# Patient Record
Sex: Male | Born: 1960 | Race: White | Hispanic: No | Marital: Single | Smoking: Current every day smoker
Health system: Southern US, Community
[De-identification: ages and names within clinical notes are randomized; demographics above are authoritative.]

## PROBLEM LIST (undated history)

## (undated) DIAGNOSIS — Z789 Other specified health status: Secondary | ICD-10-CM

## (undated) HISTORY — PX: NO PAST SURGERIES: SHX2092

---

## 2012-01-08 ENCOUNTER — Emergency Department (HOSPITAL_COMMUNITY): Payer: Self-pay

## 2012-01-08 ENCOUNTER — Encounter (HOSPITAL_COMMUNITY): Payer: Self-pay | Admitting: Emergency Medicine

## 2012-01-08 ENCOUNTER — Inpatient Hospital Stay (HOSPITAL_COMMUNITY)
Admission: EM | Admit: 2012-01-08 | Discharge: 2012-01-14 | DRG: 394 | Disposition: A | Discharge status: Home / Self Care | Payer: Self-pay | Attending: General Surgery | Admitting: General Surgery

## 2012-01-08 DIAGNOSIS — D62 Acute posthemorrhagic anemia: Secondary | ICD-10-CM | POA: Diagnosis present

## 2012-01-08 DIAGNOSIS — T797XXA Traumatic subcutaneous emphysema, initial encounter: Secondary | ICD-10-CM | POA: Diagnosis present

## 2012-01-08 DIAGNOSIS — S32009A Unspecified fracture of unspecified lumbar vertebra, initial encounter for closed fracture: Secondary | ICD-10-CM

## 2012-01-08 DIAGNOSIS — S7010XA Contusion of unspecified thigh, initial encounter: Secondary | ICD-10-CM | POA: Diagnosis present

## 2012-01-08 DIAGNOSIS — F1010 Alcohol abuse, uncomplicated: Secondary | ICD-10-CM | POA: Diagnosis present

## 2012-01-08 DIAGNOSIS — S01502A Unspecified open wound of oral cavity, initial encounter: Secondary | ICD-10-CM | POA: Diagnosis present

## 2012-01-08 DIAGNOSIS — A4901 Methicillin susceptible Staphylococcus aureus infection, unspecified site: Secondary | ICD-10-CM | POA: Diagnosis present

## 2012-01-08 DIAGNOSIS — L0201 Cutaneous abscess of face: Secondary | ICD-10-CM | POA: Diagnosis not present

## 2012-01-08 DIAGNOSIS — S0083XA Contusion of other part of head, initial encounter: Secondary | ICD-10-CM | POA: Diagnosis present

## 2012-01-08 DIAGNOSIS — F17200 Nicotine dependence, unspecified, uncomplicated: Secondary | ICD-10-CM | POA: Diagnosis present

## 2012-01-08 DIAGNOSIS — S01512A Laceration without foreign body of oral cavity, initial encounter: Secondary | ICD-10-CM | POA: Diagnosis present

## 2012-01-08 DIAGNOSIS — L03211 Cellulitis of face: Secondary | ICD-10-CM | POA: Diagnosis not present

## 2012-01-08 DIAGNOSIS — Y9289 Other specified places as the place of occurrence of the external cause: Secondary | ICD-10-CM

## 2012-01-08 DIAGNOSIS — S36899A Unspecified injury of other intra-abdominal organs, initial encounter: Secondary | ICD-10-CM | POA: Diagnosis present

## 2012-01-08 DIAGNOSIS — S3681XA Injury of peritoneum, initial encounter: Secondary | ICD-10-CM

## 2012-01-08 DIAGNOSIS — K921 Melena: Secondary | ICD-10-CM | POA: Diagnosis present

## 2012-01-08 HISTORY — DX: Other specified health status: Z78.9

## 2012-01-08 LAB — URINALYSIS, ROUTINE W REFLEX MICROSCOPIC
Glucose, UA: NEGATIVE mg/dL
Hgb urine dipstick: NEGATIVE
Protein, ur: NEGATIVE mg/dL
Specific Gravity, Urine: 1.025 (ref 1.005–1.030)
Urobilinogen, UA: 1 mg/dL (ref 0.0–1.0)

## 2012-01-08 LAB — CBC
MCHC: 33.8 g/dL (ref 30.0–36.0)
Platelets: 88 10*3/uL — ABNORMAL LOW (ref 150–400)
RDW: 13.9 % (ref 11.5–15.5)
WBC: 9 10*3/uL (ref 4.0–10.5)

## 2012-01-08 LAB — COMPREHENSIVE METABOLIC PANEL
AST: 65 U/L — ABNORMAL HIGH (ref 0–37)
Albumin: 2.7 g/dL — ABNORMAL LOW (ref 3.5–5.2)
Alkaline Phosphatase: 137 U/L — ABNORMAL HIGH (ref 39–117)
Chloride: 98 mEq/L (ref 96–112)
Potassium: 4.6 mEq/L (ref 3.5–5.1)
Sodium: 133 mEq/L — ABNORMAL LOW (ref 135–145)
Total Bilirubin: 1.3 mg/dL — ABNORMAL HIGH (ref 0.3–1.2)
Total Protein: 6.5 g/dL (ref 6.0–8.3)

## 2012-01-08 LAB — URINE MICROSCOPIC-ADD ON

## 2012-01-08 LAB — PROTIME-INR
INR: 1.18 (ref 0.00–1.49)
Prothrombin Time: 15.2 seconds (ref 11.6–15.2)

## 2012-01-08 MED ORDER — LACTATED RINGERS IV BOLUS (SEPSIS): 1000.0000 mL | Freq: Three times a day (TID) | INTRAVENOUS | Status: DC | PRN

## 2012-01-08 MED ORDER — LORAZEPAM 1 MG PO TABS
0.0000 mg | ORAL_TABLET | Freq: Two times a day (BID) | ORAL | Status: AC
  Administered 2012-01-11: 1 mg via ORAL

## 2012-01-08 MED ORDER — LORAZEPAM 2 MG/ML IJ SOLN
1.0000 mg | Freq: Four times a day (QID) | INTRAMUSCULAR | Status: AC | PRN
  Administered 2012-01-09: 1 mg via INTRAVENOUS
  Filled 2012-01-08: qty 1

## 2012-01-08 MED ORDER — ONDANSETRON HCL 4 MG/2ML IJ SOLN
4.0000 mg | Freq: Once | INTRAMUSCULAR | Status: AC
  Administered 2012-01-08: 4 mg via INTRAVENOUS
  Filled 2012-01-08: qty 2

## 2012-01-08 MED ORDER — ONDANSETRON 8 MG/NS 50 ML IVPB
8.0000 mg | Freq: Four times a day (QID) | INTRAVENOUS | Status: DC | PRN
  Filled 2012-01-08: qty 8

## 2012-01-08 MED ORDER — FOLIC ACID 1 MG PO TABS
1.0000 mg | ORAL_TABLET | Freq: Every day | ORAL | Status: DC
  Administered 2012-01-09 – 2012-01-14 (×6): 1 mg via ORAL
  Filled 2012-01-08 (×6): qty 1

## 2012-01-08 MED ORDER — THIAMINE HCL 100 MG/ML IJ SOLN
100.0000 mg | Freq: Every day | INTRAMUSCULAR | Status: DC
  Administered 2012-01-10: 100 mg via INTRAVENOUS
  Filled 2012-01-08 (×6): qty 1

## 2012-01-08 MED ORDER — HYDROMORPHONE HCL PF 2 MG/ML IJ SOLN
2.0000 mg | Freq: Once | INTRAMUSCULAR | Status: AC
  Administered 2012-01-08: 2 mg via INTRAVENOUS
  Filled 2012-01-08: qty 1

## 2012-01-08 MED ORDER — ONDANSETRON HCL 4 MG/2ML IJ SOLN: 4.0000 mg | Freq: Four times a day (QID) | INTRAMUSCULAR | Status: DC | PRN

## 2012-01-08 MED ORDER — LORAZEPAM 1 MG PO TABS
1.0000 mg | ORAL_TABLET | Freq: Four times a day (QID) | ORAL | Status: AC | PRN
  Administered 2012-01-09: 1 mg via ORAL
  Filled 2012-01-08 (×2): qty 1

## 2012-01-08 MED ORDER — BISACODYL 10 MG RE SUPP: 10.0000 mg | Freq: Two times a day (BID) | RECTAL | Status: DC | PRN

## 2012-01-08 MED ORDER — MAGIC MOUTHWASH
15.0000 mL | Freq: Four times a day (QID) | ORAL | Status: DC | PRN
  Filled 2012-01-08: qty 15

## 2012-01-08 MED ORDER — DIPHENHYDRAMINE HCL 50 MG/ML IJ SOLN: 12.5000 mg | Freq: Four times a day (QID) | INTRAMUSCULAR | Status: DC | PRN

## 2012-01-08 MED ORDER — ALUM & MAG HYDROXIDE-SIMETH 200-200-20 MG/5ML PO SUSP: 30.0000 mL | Freq: Four times a day (QID) | ORAL | Status: DC | PRN

## 2012-01-08 MED ORDER — MAGNESIUM HYDROXIDE 400 MG/5ML PO SUSP
30.0000 mL | Freq: Two times a day (BID) | ORAL | Status: DC | PRN
Start: 2012-01-08 — End: 2012-01-14

## 2012-01-08 MED ORDER — HYDROMORPHONE HCL PF 1 MG/ML IJ SOLN
0.5000 mg | INTRAMUSCULAR | Status: DC | PRN
  Administered 2012-01-09 (×3): 1 mg via INTRAVENOUS
  Administered 2012-01-09: 2 mg via INTRAVENOUS
  Administered 2012-01-09 – 2012-01-10 (×7): 1 mg via INTRAVENOUS
  Filled 2012-01-08 (×5): qty 1
  Filled 2012-01-08: qty 2
  Filled 2012-01-08 (×5): qty 1

## 2012-01-08 MED ORDER — ACETAMINOPHEN 325 MG PO TABS
650.0000 mg | ORAL_TABLET | Freq: Four times a day (QID) | ORAL | Status: DC
  Administered 2012-01-09 – 2012-01-11 (×9): 650 mg via ORAL
  Filled 2012-01-08 (×9): qty 2

## 2012-01-08 MED ORDER — LORAZEPAM 1 MG PO TABS
0.0000 mg | ORAL_TABLET | Freq: Four times a day (QID) | ORAL | Status: AC
  Administered 2012-01-09: 1 mg via ORAL
  Administered 2012-01-09: 2 mg via ORAL
  Administered 2012-01-09 – 2012-01-10 (×3): 1 mg via ORAL
  Filled 2012-01-08 (×3): qty 1
  Filled 2012-01-08: qty 2
  Filled 2012-01-08: qty 1

## 2012-01-08 MED ORDER — HYDROMORPHONE BOLUS VIA INFUSION: 0.5000 mg | INTRAVENOUS | Status: DC | PRN

## 2012-01-08 MED ORDER — IOHEXOL 300 MG/ML  SOLN
100.0000 mL | Freq: Once | INTRAMUSCULAR | Status: AC | PRN
  Administered 2012-01-08: 100 mL via INTRAVENOUS

## 2012-01-08 MED ORDER — PSYLLIUM 95 % PO PACK
1.0000 | PACK | Freq: Two times a day (BID) | ORAL | Status: DC
  Administered 2012-01-09 – 2012-01-12 (×9): 1 via ORAL
  Filled 2012-01-08 (×13): qty 1

## 2012-01-08 MED ORDER — ZOLPIDEM TARTRATE 5 MG PO TABS: 5.0000 mg | ORAL_TABLET | Freq: Every evening | ORAL | Status: DC | PRN

## 2012-01-08 MED ORDER — PROMETHAZINE HCL 25 MG/ML IJ SOLN
12.5000 mg | Freq: Four times a day (QID) | INTRAMUSCULAR | Status: DC | PRN
  Filled 2012-01-08: qty 1

## 2012-01-08 MED ORDER — LIP MEDEX EX OINT
1.0000 "application " | TOPICAL_OINTMENT | Freq: Two times a day (BID) | CUTANEOUS | Status: DC
  Administered 2012-01-09: 1 via TOPICAL
  Filled 2012-01-08: qty 7

## 2012-01-08 MED ORDER — HYDROMORPHONE HCL PF 1 MG/ML IJ SOLN
1.0000 mg | Freq: Once | INTRAMUSCULAR | Status: AC
  Administered 2012-01-08: 1 mg via INTRAVENOUS
  Filled 2012-01-08: qty 1

## 2012-01-08 MED ORDER — VITAMIN B-1 100 MG PO TABS
100.0000 mg | ORAL_TABLET | Freq: Every day | ORAL | Status: DC
  Administered 2012-01-09 – 2012-01-14 (×5): 100 mg via ORAL
  Filled 2012-01-08 (×6): qty 1

## 2012-01-08 MED ORDER — SODIUM CHLORIDE 0.9 % IV SOLN
Freq: Once | INTRAVENOUS | Status: AC
  Administered 2012-01-08: 21:00:00 via INTRAVENOUS

## 2012-01-08 MED ORDER — TAB-A-VITE/IRON PO TABS
1.0000 | ORAL_TABLET | Freq: Every day | ORAL | Status: DC
  Administered 2012-01-09 – 2012-01-14 (×6): 1 via ORAL
  Filled 2012-01-08 (×7): qty 1

## 2012-01-08 MED ORDER — LACTATED RINGERS IV SOLN
INTRAVENOUS | Status: DC
  Administered 2012-01-09: 01:00:00 via INTRAVENOUS
  Administered 2012-01-11: 1000 mL via INTRAVENOUS
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
## 2012-01-09 ENCOUNTER — Encounter (HOSPITAL_COMMUNITY): Payer: Self-pay | Admitting: Infectious Diseases

## 2012-01-09 DIAGNOSIS — S36113A Laceration of liver, unspecified degree, initial encounter: Secondary | ICD-10-CM

## 2012-01-09 LAB — CREATININE, SERUM
Creatinine, Ser: 0.74 mg/dL (ref 0.50–1.35)
GFR calc Af Amer: >90 mL/min (ref >90)
GFR calc non Af Amer: >90 mL/min (ref >90)

## 2012-01-09 LAB — MRSA PCR SCREENING: MRSA by PCR: NEGATIVE

## 2012-01-09 LAB — GLUCOSE, CAPILLARY: Glucose-Capillary: 133 mg/dL — ABNORMAL HIGH (ref 70–99)

## 2012-01-09 MED ORDER — PNEUMOCOCCAL VAC POLYVALENT 25 MCG/0.5ML IJ INJ
0.5000 mL | INJECTION | INTRAMUSCULAR | Status: AC
  Administered 2012-01-10: 0.5 mL via INTRAMUSCULAR
  Filled 2012-01-09: qty 0.5

## 2012-01-09 MED ORDER — BLISTEX EX OINT
TOPICAL_OINTMENT | Freq: Two times a day (BID) | CUTANEOUS | Status: DC
  Administered 2012-01-09 – 2012-01-14 (×10): via TOPICAL
  Filled 2012-01-09: qty 10

## 2012-01-09 MED ORDER — HYDROCODONE-ACETAMINOPHEN 5-325 MG PO TABS
1.0000 | ORAL_TABLET | ORAL | Status: DC | PRN
  Administered 2012-01-09 – 2012-01-13 (×11): 2 via ORAL
  Filled 2012-01-09 (×11): qty 2

## 2012-01-09 MED ORDER — CARBAMIDE PEROXIDE 10 % MT SOLN
Freq: Four times a day (QID) | OROMUCOSAL | Status: AC
  Administered 2012-01-09 – 2012-01-10 (×2): via DENTAL
  Filled 2012-01-09 (×3): qty 60

## 2012-01-09 MED ORDER — BIOTENE DRY MOUTH MT LIQD
15.0000 mL | Freq: Two times a day (BID) | OROMUCOSAL | Status: DC
  Administered 2012-01-09 – 2012-01-14 (×9): 15 mL via OROMUCOSAL

## 2012-01-09 NOTE — ED Provider Notes (Signed)
Medical screening examination/treatment/procedure(s) were conducted as a shared visit with non-physician practitioner(s) and myself.  I personally evaluated the patient during the encounter  Doug Sou, MD 01/09/12 1456
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
## 2012-01-09 NOTE — Progress Notes (Signed)
Patient admitted from [facility]. Personal belongings brought to room with patient and remain at the bedside. Patient states that he does drink and smoke (3/4-1ppd) daily. He had a tick embedded in his left upper shoulder which I removed and cleansed with chlorhexidine swabs. Ice given to patient for cheek lac, which continues to bleed. Resting comfortably. Will continue to monitor.

## 2012-01-10 LAB — CBC
HCT: 19.7 % — ABNORMAL LOW (ref 39.0–52.0)
Hemoglobin: 6.9 g/dL — CL (ref 13.0–17.0)
MCH: 34.7 pg — ABNORMAL HIGH (ref 26.0–34.0)
MCHC: 35 g/dL (ref 30.0–36.0)
MCV: 99 fL (ref 78.0–100.0)
Platelets: 59 10*3/uL — ABNORMAL LOW (ref 150–400)
RBC: 1.99 MIL/uL — ABNORMAL LOW (ref 4.22–5.81)
RDW: 14 % (ref 11.5–15.5)
WBC: 8.4 10*3/uL (ref 4.0–10.5)

## 2012-01-10 LAB — DIFFERENTIAL
Basophils Absolute: 0 10*3/uL (ref 0.0–0.1)
Basophils Relative: 0 % (ref 0–1)
Eosinophils Absolute: 0.3 10*3/uL (ref 0.0–0.7)
Monocytes Absolute: 1.2 10*3/uL — ABNORMAL HIGH (ref 0.1–1.0)
Neutro Abs: 4.4 10*3/uL (ref 1.7–7.7)
Neutrophils Relative %: 53 % (ref 43–77)

## 2012-01-10 LAB — ABO/RH: ABO/RH(D): B POS

## 2012-01-10 LAB — PREPARE RBC (CROSSMATCH)

## 2012-01-10 LAB — HEMOGLOBIN AND HEMATOCRIT, BLOOD
HCT: 19.1 % — ABNORMAL LOW (ref 39.0–52.0)
Hemoglobin: 6.8 g/dL — CL (ref 13.0–17.0)
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
## 2012-01-10 NOTE — Significant Event (Signed)
Rapid Response Event Note  Overview: Called to see patient for 2nd nursing assessment. Reviewed history with RN. Of note: hgb dropped to 6.9 this AM and BP is soft at 101/33.       Initial Focused Assessment: Patient is conversant, oriented to place, able to answer questions. Complains of some pain, abdominal and jaw (lacerations of mouth with stitches) Skin warm and dry without obvious pallor. Pulse and HR stable Abdomen firm but  no distension. RN reports BP has come up to 120 systolic range. MD at bedside.    Interventions: Repeat HBG drawn. WIll assist as needed.   Event Summary:   at      at          Kristine Linea

## 2012-01-10 NOTE — Progress Notes (Signed)
CRITICAL VALUE ALERT  Critical value received:  Hemoglobin 6.9  Date of notification: 01/10/2012  Time of notification:  0748  Critical value read back:  Yes  Nurse who received alert:  Roland Rack, RN MD notified (1st page):  Almond Lint, MD  Time of first page:  [phone] MD notified (2nd page):  Time of second page:  Responding MD:  Almond Lint, MD  Time MD responded:  [phone]
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
## 2012-01-11 LAB — TYPE AND SCREEN: Unit division: 0

## 2012-01-11 LAB — CBC
Platelets: 63 10*3/uL — ABNORMAL LOW (ref 150–400)
RDW: 14.7 % (ref 11.5–15.5)
WBC: 5.8 10*3/uL (ref 4.0–10.5)

## 2012-01-11 MED ORDER — MUPIROCIN 2 % EX OINT
TOPICAL_OINTMENT | Freq: Three times a day (TID) | CUTANEOUS | Status: DC
  Administered 2012-01-11 – 2012-01-12 (×3): via TOPICAL
  Administered 2012-01-12: 1 via TOPICAL
  Administered 2012-01-12 – 2012-01-14 (×6): via TOPICAL
  Filled 2012-01-11 (×2): qty 22

## 2012-01-11 MED ORDER — PIPERACILLIN-TAZOBACTAM 3.375 G IVPB 30 MIN
3.3750 g | INTRAVENOUS | Status: AC
  Administered 2012-01-11: 3.375 g via INTRAVENOUS
  Filled 2012-01-11: qty 50

## 2012-01-11 MED ORDER — METHOCARBAMOL 500 MG PO TABS
500.0000 mg | ORAL_TABLET | Freq: Four times a day (QID) | ORAL | Status: DC | PRN
  Filled 2012-01-11: qty 2

## 2012-01-11 MED ORDER — HYDROMORPHONE HCL PF 1 MG/ML IJ SOLN
0.5000 mg | INTRAMUSCULAR | Status: DC | PRN
  Administered 2012-01-11 – 2012-01-12 (×4): 0.5 mg via INTRAVENOUS
  Filled 2012-01-11 (×5): qty 1

## 2012-01-11 MED ORDER — VANCOMYCIN HCL 1000 MG IV SOLR
1500.0000 mg | Freq: Two times a day (BID) | INTRAVENOUS | Status: DC
  Administered 2012-01-12 – 2012-01-14 (×6): 1500 mg via INTRAVENOUS
  Filled 2012-01-11 (×9): qty 1500

## 2012-01-11 MED ORDER — PIPERACILLIN-TAZOBACTAM 3.375 G IVPB
3.3750 g | Freq: Three times a day (TID) | INTRAVENOUS | Status: DC
  Administered 2012-01-11 – 2012-01-14 (×9): 3.375 g via INTRAVENOUS
  Filled 2012-01-11 (×11): qty 50

## 2012-01-11 MED ORDER — CHLORHEXIDINE GLUCONATE 0.12 % MT SOLN
15.0000 mL | Freq: Two times a day (BID) | OROMUCOSAL | Status: DC
  Administered 2012-01-11 – 2012-01-14 (×4): 15 mL via OROMUCOSAL
  Filled 2012-01-11 (×4): qty 15

## 2012-01-11 MED ORDER — SODIUM CHLORIDE 0.9 % IV SOLN
1750.0000 mg | INTRAVENOUS | Status: AC
  Administered 2012-01-11: 1750 mg via INTRAVENOUS
  Filled 2012-01-11: qty 1750
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
## 2012-01-11 NOTE — Progress Notes (Signed)
OT SCREEN  OT order received. Chart reviewed. Pt @ baseline level of funcitoning with ADL. No OT needed at this time.OT signing off. [facility], OTR/L  [phone] 01/11/2012
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
## 2012-01-11 NOTE — Progress Notes (Signed)
May have an infection of the cheek.  Will get ENT involved.  This patient has been seen and I agree with the findings and treatment plan.  Marta Lamas. Gae Bon, MD, FACS [phone] (pager) [phone] (direct pager) Trauma Surgeon

## 2012-01-12 LAB — CBC
HCT: 28.9 % — ABNORMAL LOW (ref 39.0–52.0)
Hemoglobin: 9.9 g/dL — ABNORMAL LOW (ref 13.0–17.0)
MCH: 33.2 pg (ref 26.0–34.0)
MCHC: 34.3 g/dL (ref 30.0–36.0)
MCV: 97 fL (ref 78.0–100.0)
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
## 2012-01-13 LAB — CBC
MCH: 33.2 pg (ref 26.0–34.0)
MCHC: 34.3 g/dL (ref 30.0–36.0)
MCV: 96.9 fL (ref 78.0–100.0)
Platelets: 77 10*3/uL — ABNORMAL LOW (ref 150–400)
RBC: 2.92 MIL/uL — ABNORMAL LOW (ref 4.22–5.81)

## 2012-01-13 MED ORDER — DOCUSATE SODIUM 100 MG PO CAPS
100.0000 mg | ORAL_CAPSULE | Freq: Two times a day (BID) | ORAL | Status: DC
  Administered 2012-01-13 – 2012-01-14 (×3): 100 mg via ORAL
  Filled 2012-01-13 (×3): qty 1

## 2012-01-13 MED ORDER — PANTOPRAZOLE SODIUM 40 MG PO TBEC
40.0000 mg | DELAYED_RELEASE_TABLET | Freq: Every day | ORAL | Status: DC
  Administered 2012-01-13 – 2012-01-14 (×2): 40 mg via ORAL
  Filled 2012-01-13 (×2): qty 1

## 2012-01-13 MED ORDER — POLYETHYLENE GLYCOL 3350 17 G PO PACK
17.0000 g | PACK | Freq: Every day | ORAL | Status: DC
  Administered 2012-01-13 – 2012-01-14 (×2): 17 g via ORAL
  Filled 2012-01-13 (×2): qty 1
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
## 2012-01-13 NOTE — Progress Notes (Signed)
Patient having abdominal pain, but eating well and objectively without peritonitis.  Having dark, black stools.  Not on Protonix.  Will review CT.  This patient has been seen and I agree with the findings and treatment plan.  Marta Lamas. Gae Bon, MD, FACS [phone] (pager) [phone] (direct pager) Trauma Surgeon

## 2012-01-14 LAB — CULTURE, ROUTINE-ABSCESS

## 2012-01-14 MED ORDER — DOXYCYCLINE HYCLATE 100 MG PO TABS: 100.0000 mg | ORAL_TABLET | Freq: Two times a day (BID) | ORAL | Status: AC

## 2012-01-14 MED ORDER — HYDROCODONE-ACETAMINOPHEN 5-325 MG PO TABS: 1.0000 | ORAL_TABLET | ORAL | Status: AC | PRN

## 2012-01-14 NOTE — Discharge Instructions (Signed)
Wash face daily with soap and water.  Stitches in cheek will dissolve in time.

## 2012-01-14 NOTE — Progress Notes (Signed)
Medications obtained through hospital indigent fund for pt.
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
## 2012-01-14 NOTE — Discharge Summary (Signed)
Agree with above.   Discharge today.  Wilmon Arms. Corliss Skains, MD, [facility]  01/14/2012 3:16 PM

## 2012-01-14 NOTE — Progress Notes (Signed)
Discharge instructions reviewed with pt and medications and prescription given.  Pt verbalized understanding and had no questions.  Pt discharged in stable condition.  Hector Shade [place]
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
## 2012-01-15 LAB — WOUND CULTURE

## 2012-01-16 LAB — ANAEROBIC CULTURE

## 2012-03-30 ENCOUNTER — Emergency Department (HOSPITAL_COMMUNITY)
Admission: EM | Admit: 2012-03-30 | Discharge: 2012-03-30 | Disposition: A | Discharge status: Home / Self Care | Payer: Self-pay | Attending: Emergency Medicine | Admitting: Emergency Medicine

## 2012-03-30 ENCOUNTER — Emergency Department (HOSPITAL_COMMUNITY): Payer: Self-pay

## 2012-03-30 DIAGNOSIS — W34010A Accidental discharge of airgun, initial encounter: Secondary | ICD-10-CM | POA: Insufficient documentation

## 2012-03-30 DIAGNOSIS — Y92009 Unspecified place in unspecified non-institutional (private) residence as the place of occurrence of the external cause: Secondary | ICD-10-CM | POA: Insufficient documentation

## 2012-03-30 DIAGNOSIS — S61409A Unspecified open wound of unspecified hand, initial encounter: Secondary | ICD-10-CM | POA: Insufficient documentation

## 2012-03-30 DIAGNOSIS — W3400XA Accidental discharge from unspecified firearms or gun, initial encounter: Secondary | ICD-10-CM

## 2012-03-30 DIAGNOSIS — F17200 Nicotine dependence, unspecified, uncomplicated: Secondary | ICD-10-CM | POA: Insufficient documentation

## 2012-03-30 MED ORDER — CEPHALEXIN 250 MG PO CAPS
250.0000 mg | ORAL_CAPSULE | Freq: Once | ORAL | Status: AC
  Administered 2012-03-30: 250 mg via ORAL
  Filled 2012-03-30: qty 1

## 2012-03-30 MED ORDER — CEPHALEXIN 250 MG PO CAPS: 250.0000 mg | ORAL_CAPSULE | Freq: Three times a day (TID) | ORAL | Status: AC

## 2012-03-30 MED ORDER — IBUPROFEN 800 MG PO TABS
800.0000 mg | ORAL_TABLET | Freq: Once | ORAL | Status: AC
  Administered 2012-03-30: 800 mg via ORAL

## 2012-03-30 MED ORDER — IBUPROFEN 800 MG PO TABS
ORAL_TABLET | ORAL | Status: AC
  Administered 2012-03-30: 800 mg via ORAL
  Filled 2012-03-30: qty 1
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
## 2012-03-30 NOTE — ED Notes (Signed)
MD at bedside. 

## 2012-04-22 ENCOUNTER — Emergency Department (HOSPITAL_COMMUNITY)
Admission: EM | Admit: 2012-04-22 | Discharge: 2012-04-22 | Disposition: A | Discharge status: Home / Self Care | Payer: Self-pay | Attending: Emergency Medicine | Admitting: Emergency Medicine

## 2012-04-22 ENCOUNTER — Encounter (HOSPITAL_COMMUNITY): Payer: Self-pay

## 2012-04-22 DIAGNOSIS — Z5900 Homelessness unspecified: Secondary | ICD-10-CM | POA: Insufficient documentation

## 2012-04-22 DIAGNOSIS — T2220XA Burn of second degree of shoulder and upper limb, except wrist and hand, unspecified site, initial encounter: Secondary | ICD-10-CM | POA: Insufficient documentation

## 2012-04-22 DIAGNOSIS — T23209A Burn of second degree of unspecified hand, unspecified site, initial encounter: Secondary | ICD-10-CM | POA: Insufficient documentation

## 2012-04-22 DIAGNOSIS — F17200 Nicotine dependence, unspecified, uncomplicated: Secondary | ICD-10-CM | POA: Insufficient documentation

## 2012-04-22 DIAGNOSIS — Z23 Encounter for immunization: Secondary | ICD-10-CM | POA: Insufficient documentation

## 2012-04-22 DIAGNOSIS — X010XXA Exposure to flames in uncontrolled fire, not in building or structure, initial encounter: Secondary | ICD-10-CM | POA: Insufficient documentation

## 2012-04-22 DIAGNOSIS — T310 Burns involving less than 10% of body surface: Secondary | ICD-10-CM | POA: Insufficient documentation

## 2012-04-22 DIAGNOSIS — T2124XA Burn of second degree of lower back, initial encounter: Secondary | ICD-10-CM | POA: Insufficient documentation

## 2012-04-22 MED ORDER — TETANUS-DIPHTH-ACELL PERTUSSIS 5-2.5-18.5 LF-MCG/0.5 IM SUSP
0.5000 mL | Freq: Once | INTRAMUSCULAR | Status: AC
  Administered 2012-04-22: 0.5 mL via INTRAMUSCULAR
  Filled 2012-04-22: qty 0.5

## 2012-04-22 MED ORDER — SILVER SULFADIAZINE 1 % EX CREA
TOPICAL_CREAM | Freq: Once | CUTANEOUS | Status: AC
  Administered 2012-04-22: 20:00:00 via TOPICAL
  Filled 2012-04-22: qty 85
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
## 2012-04-22 NOTE — ED Notes (Signed)
Pt. Verbalized understanding of discharge instructions 

## 2012-08-20 ENCOUNTER — Emergency Department (HOSPITAL_COMMUNITY): Payer: Self-pay

## 2012-08-20 ENCOUNTER — Encounter (HOSPITAL_COMMUNITY): Payer: Self-pay | Admitting: Radiology

## 2012-08-20 ENCOUNTER — Emergency Department (HOSPITAL_COMMUNITY)
Admission: EM | Admit: 2012-08-20 | Discharge: 2012-08-21 | Disposition: A | Discharge status: Home / Self Care | Payer: No Typology Code available for payment source | Attending: Emergency Medicine | Admitting: Emergency Medicine

## 2012-08-20 DIAGNOSIS — S335XXA Sprain of ligaments of lumbar spine, initial encounter: Secondary | ICD-10-CM | POA: Insufficient documentation

## 2012-08-20 DIAGNOSIS — F10929 Alcohol use, unspecified with intoxication, unspecified: Secondary | ICD-10-CM

## 2012-08-20 DIAGNOSIS — F1010 Alcohol abuse, uncomplicated: Secondary | ICD-10-CM | POA: Insufficient documentation

## 2012-08-20 DIAGNOSIS — Y92410 Unspecified street and highway as the place of occurrence of the external cause: Secondary | ICD-10-CM | POA: Insufficient documentation

## 2012-08-20 DIAGNOSIS — S39012A Strain of muscle, fascia and tendon of lower back, initial encounter: Secondary | ICD-10-CM

## 2012-08-20 DIAGNOSIS — F17200 Nicotine dependence, unspecified, uncomplicated: Secondary | ICD-10-CM | POA: Insufficient documentation

## 2012-08-20 DIAGNOSIS — S0101XA Laceration without foreign body of scalp, initial encounter: Secondary | ICD-10-CM

## 2012-08-20 DIAGNOSIS — S0100XA Unspecified open wound of scalp, initial encounter: Secondary | ICD-10-CM | POA: Insufficient documentation

## 2012-08-20 DIAGNOSIS — Y9389 Activity, other specified: Secondary | ICD-10-CM | POA: Insufficient documentation

## 2012-08-20 LAB — CBC WITH DIFFERENTIAL/PLATELET
Eosinophils Absolute: 0.2 10*3/uL (ref 0.0–0.7)
Eosinophils Relative: 4 % (ref 0–5)
HCT: 26.7 % — ABNORMAL LOW (ref 39.0–52.0)
Hemoglobin: 9 g/dL — ABNORMAL LOW (ref 13.0–17.0)
Lymphs Abs: 2.1 10*3/uL (ref 0.7–4.0)
MCH: 30.8 pg (ref 26.0–34.0)
MCV: 91.4 fL (ref 78.0–100.0)
Monocytes Relative: 9 % (ref 3–12)
RBC: 2.92 MIL/uL — ABNORMAL LOW (ref 4.22–5.81)

## 2012-08-20 LAB — RAPID URINE DRUG SCREEN, HOSP PERFORMED
Amphetamines: NOT DETECTED
Benzodiazepines: NOT DETECTED
Opiates: NOT DETECTED
Tetrahydrocannabinol: NOT DETECTED

## 2012-08-20 LAB — URINALYSIS, ROUTINE W REFLEX MICROSCOPIC
Glucose, UA: NEGATIVE mg/dL
Hgb urine dipstick: NEGATIVE
Ketones, ur: NEGATIVE mg/dL
Protein, ur: NEGATIVE mg/dL

## 2012-08-20 LAB — POCT I-STAT, CHEM 8
Calcium, Ion: 1.01 mmol/L — ABNORMAL LOW (ref 1.12–1.23)
Chloride: 108 mEq/L (ref 96–112)
Creatinine, Ser: 1 mg/dL (ref 0.50–1.35)
Glucose, Bld: 115 mg/dL — ABNORMAL HIGH (ref 70–99)
HCT: 28 % — ABNORMAL LOW (ref 39.0–52.0)

## 2012-08-20 LAB — ETHANOL: Alcohol, Ethyl (B): 313 mg/dL — ABNORMAL HIGH (ref 0–11)

## 2012-08-20 LAB — SAMPLE TO BLOOD BANK

## 2012-08-20 MED ORDER — ONDANSETRON HCL 4 MG/2ML IJ SOLN
4.0000 mg | Freq: Once | INTRAMUSCULAR | Status: AC
  Administered 2012-08-20: 4 mg via INTRAVENOUS
  Filled 2012-08-20: qty 2

## 2012-08-20 MED ORDER — HYDROMORPHONE HCL PF 1 MG/ML IJ SOLN
1.0000 mg | Freq: Once | INTRAMUSCULAR | Status: AC
  Administered 2012-08-20: 1 mg via INTRAVENOUS
  Filled 2012-08-20: qty 1

## 2012-08-20 MED ORDER — SODIUM CHLORIDE 0.9 % IV SOLN
INTRAVENOUS | Status: DC
  Administered 2012-08-20: 16:00:00 via INTRAVENOUS

## 2012-08-20 MED ORDER — IOHEXOL 300 MG/ML  SOLN
100.0000 mL | Freq: Once | INTRAMUSCULAR | Status: AC | PRN
  Administered 2012-08-20: 100 mL via INTRAVENOUS

## 2012-08-20 MED ORDER — LIDOCAINE HCL 2 % IJ SOLN
10.0000 mL | Freq: Once | INTRAMUSCULAR | Status: DC
  Filled 2012-08-20: qty 20

## 2012-08-20 NOTE — ED Notes (Signed)
Pt returned from CT. Requesting pain medicine. MD Ignacia Palma made aware. NAD.

## 2012-08-20 NOTE — ED Notes (Signed)
Pt continuing to request pain rx. MD Ignacia Palma aware. Pt in no acute distress. PT cooperative and calm, resting in bed.
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
## 2012-08-20 NOTE — ED Notes (Signed)
Pt AO x 4. Moves all extremities with no difficulty. Pt groaning, cussing and requesting pain medicine. Pt removed from spinal board by MD Ignacia Palma. Reports 10/10 lower back pain. Pt follows commands and cooperative.

## 2012-08-20 NOTE — ED Notes (Addendum)
Contacted Les, EMT in [place] lab about results of Occult blood card. Per [employer] Mechanic, it was completed and it was negative.

## 2012-08-21 MED ORDER — HYDROCODONE-ACETAMINOPHEN 5-325 MG PO TABS: 1.0000 | ORAL_TABLET | ORAL | Status: DC | PRN

## 2012-08-21 MED ORDER — OXYCODONE-ACETAMINOPHEN 5-325 MG PO TABS
2.0000 | ORAL_TABLET | Freq: Once | ORAL | Status: AC
  Administered 2012-08-21: 2 via ORAL
  Filled 2012-08-21: qty 2

## 2012-08-21 NOTE — ED Notes (Signed)
Patient constantly asking for something to drink.  # drinks given at one time.  Patient has been getting water out of the faucet in the room

## 2012-08-21 NOTE — ED Notes (Signed)
Pt discharged to home with family. NAD.  

## 2012-10-08 ENCOUNTER — Emergency Department (HOSPITAL_COMMUNITY): Payer: Self-pay

## 2012-10-08 ENCOUNTER — Emergency Department (HOSPITAL_COMMUNITY)
Admission: EM | Admit: 2012-10-08 | Discharge: 2012-10-08 | Disposition: A | Discharge status: Home / Self Care | Payer: Self-pay | Attending: Emergency Medicine | Admitting: Emergency Medicine

## 2012-10-08 ENCOUNTER — Encounter (HOSPITAL_COMMUNITY): Payer: Self-pay

## 2012-10-08 DIAGNOSIS — L03019 Cellulitis of unspecified finger: Secondary | ICD-10-CM | POA: Insufficient documentation

## 2012-10-08 DIAGNOSIS — M2550 Pain in unspecified joint: Secondary | ICD-10-CM | POA: Insufficient documentation

## 2012-10-08 DIAGNOSIS — F17200 Nicotine dependence, unspecified, uncomplicated: Secondary | ICD-10-CM | POA: Insufficient documentation

## 2012-10-08 DIAGNOSIS — L0291 Cutaneous abscess, unspecified: Secondary | ICD-10-CM

## 2012-10-08 DIAGNOSIS — L02519 Cutaneous abscess of unspecified hand: Secondary | ICD-10-CM | POA: Insufficient documentation

## 2012-10-08 DIAGNOSIS — L0390 Cellulitis, unspecified: Secondary | ICD-10-CM

## 2012-10-08 DIAGNOSIS — Z23 Encounter for immunization: Secondary | ICD-10-CM | POA: Insufficient documentation

## 2012-10-08 MED ORDER — HYDROCODONE-ACETAMINOPHEN 5-325 MG PO TABS
1.0000 | ORAL_TABLET | Freq: Once | ORAL | Status: AC
  Administered 2012-10-08: 1 via ORAL
  Filled 2012-10-08: qty 1

## 2012-10-08 MED ORDER — SULFAMETHOXAZOLE-TMP DS 800-160 MG PO TABS
1.0000 | ORAL_TABLET | Freq: Once | ORAL | Status: AC
  Administered 2012-10-08: 1 via ORAL
  Filled 2012-10-08: qty 1

## 2012-10-08 MED ORDER — SULFAMETHOXAZOLE-TRIMETHOPRIM 800-160 MG PO TABS: 1.0000 | ORAL_TABLET | Freq: Two times a day (BID) | ORAL | Status: AC

## 2012-10-08 MED ORDER — OXYCODONE-ACETAMINOPHEN 5-325 MG PO TABS
2.0000 | ORAL_TABLET | Freq: Once | ORAL | Status: AC
  Administered 2012-10-08: 2 via ORAL
  Filled 2012-10-08: qty 2

## 2012-10-08 MED ORDER — TETANUS-DIPHTH-ACELL PERTUSSIS 5-2.5-18.5 LF-MCG/0.5 IM SUSP
0.5000 mL | Freq: Once | INTRAMUSCULAR | Status: AC
  Administered 2012-10-08: 0.5 mL via INTRAMUSCULAR
  Filled 2012-10-08: qty 0.5

## 2012-10-08 NOTE — ED Provider Notes (Signed)
History     CSN: [id number]  Arrival date & time 10/08/12  1719   First MD Initiated Contact with Patient 10/08/12 2138      Chief Complaint  Patient presents with  . Hand Pain    (Consider location/radiation/quality/duration/timing/severity/associated sxs/prior treatment) HPI Comments: Patient is a 52 year old male who presents with a 4 day history of right thumb pain. Patient reports gradual onset and progressive worsening since onset. The pain is throbbing and severe without radiation. Patient denies any known injury. Patient did not try anything for symptom relief. Associated symptoms include edema of thumb joint. Palpation of the area makes the pain worse. Nothing makes the pain better.    Past Medical History  Diagnosis Date  . No pertinent past medical history     Past Surgical History  Procedure Laterality Date  . No past surgeries      No family history on file.  History  Substance Use Topics  . Smoking status: Current Every Day Smoker -- 1.00 packs/day    Types: Cigarettes  . Smokeless tobacco: Not on file  . Alcohol Use: Yes     Comment: 6-12 cans beer / night at times      Review of Systems  Musculoskeletal: Positive for arthralgias.  All other systems reviewed and are negative.    Allergies  Review of patient's allergies indicates no known allergies.  Home Medications   Current Outpatient Rx  Name  Route  Sig  Dispense  Refill  . HYDROcodone-acetaminophen (NORCO/VICODIN) 5-325 MG per tablet   Oral   Take 1 tablet by mouth every 4 (four) hours as needed for pain.   20 tablet   0     BP 136/93  Pulse 100  Temp(Src) 98.4 F (36.9 C) (Oral)  Resp 20  SpO2 98%  Physical Exam  Nursing note and vitals reviewed. Constitutional: He is oriented to person, place, and time. He appears well-developed and well-nourished. No distress.  HENT:  Head: Normocephalic and atraumatic.  Eyes: Conjunctivae and EOM are normal.  Neck: Normal range of  motion. Neck supple.  Cardiovascular: Normal rate and regular rhythm.  Exam reveals no gallop and no friction rub.   No murmur heard. Pulmonary/Chest: Effort normal and breath sounds normal. He has no wheezes. He has no rales. He exhibits no tenderness.  Abdominal: Soft. There is no tenderness.  Musculoskeletal:  Right thumb joint edematous and erythematous and tender to palpation. Patient able to move thumb at all joints without difficulty.   Neurological: He is alert and oriented to person, place, and time. Coordination normal.  Strength and sensation equal and intact bilaterally. Speech is goal-oriented. Moves limbs without ataxia.   Skin: Skin is warm and dry.  Psychiatric: He has a normal mood and affect. His behavior is normal.    ED Course  Procedures (including critical care time)  INCISION AND DRAINAGE Performed by: Emilia Beck Consent: Verbal consent obtained. Risks and benefits: risks, benefits and alternatives were discussed Type: abscess  Body area: right thumb joint  Anesthesia: local infiltration  Incision was made with a scalpel.  Local anesthetic: lidocaine 2% without epinephrine  Anesthetic total: 1 ml  Complexity: complex Blunt dissection to break up loculations  Drainage: purulent  Drainage amount: 3 mL  Packing material: none  Patient tolerance: Patient tolerated the procedure well with no immediate complications.     Labs Reviewed - No data to display Dg Finger Thumb Right  10/08/2012  *RADIOLOGY REPORT*  Clinical Data:  Soft tissue swelling.  No known injury.  RIGHT THUMB 2+V  Comparison: None  Findings: Metal foreign body is present in the soft tissues of the thumb with surrounding soft tissue swelling.  The foreign body measures approximately 5 mm with several tiny flecks of density adjacent to the larger piece.  No gas in the soft tissues.  No bony abnormality.  IMPRESSION: Metal foreign body in the soft tissues with surrounding soft  tissue swelling.   Original Report Authenticated By: Janeece Riggers, M.D.      1. Abscess and cellulitis       MDM  10:00 PM Patient will have percocet for pain. I will attempt to remove the foreign body. Patient will have tetanus shot and antibiotics here.   11:15 PM Infection drained without complication. Patient reports relief. Patient will be discharged without further evaluation. Vitals stable and patient afebrile.       Emilia Beck, PA-C 10/08/12 2341

## 2012-10-08 NOTE — ED Notes (Signed)
Pt states approximately 4 days ago he began no notice some swelling to his right thumb.  Pt denies injury or trauma.  Pt admits he took a needle and punctured thumb to see if he could relieve the swelling.  Pt states only a little blood came out.

## 2012-10-09 NOTE — ED Provider Notes (Signed)
Medical screening examination/treatment/procedure(s) were performed by non-physician practitioner and as supervising physician I was immediately available for consultation/collaboration.   Glynn Octave, MD 10/09/12 [phone]

## 2014-02-27 ENCOUNTER — Other Ambulatory Visit: Payer: Self-pay | Admitting: Emergency Medicine

## 2014-02-27 DIAGNOSIS — M25561 Pain in right knee: Secondary | ICD-10-CM

## 2014-03-05 ENCOUNTER — Inpatient Hospital Stay: Admission: RE | Admit: 2014-03-05 | Payer: Self-pay | Source: Ambulatory Visit
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
## 2014-07-18 IMAGING — CR DG FINGER THUMB 2+V*R*
3 series · 3 of 3 positions shown · non-contrast
Comparison: None

CLINICAL DATA: Soft tissue swelling.  No known injury.

RIGHT THUMB 2+V

[x finger pa right]
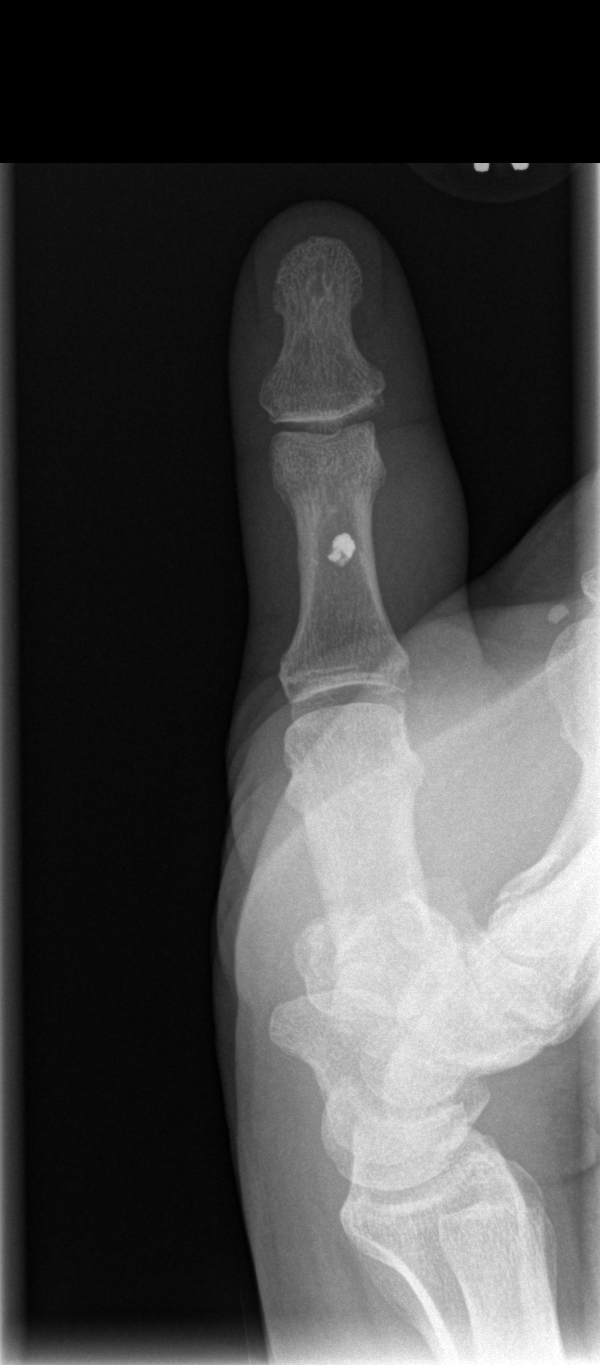

[x finger obl. right]
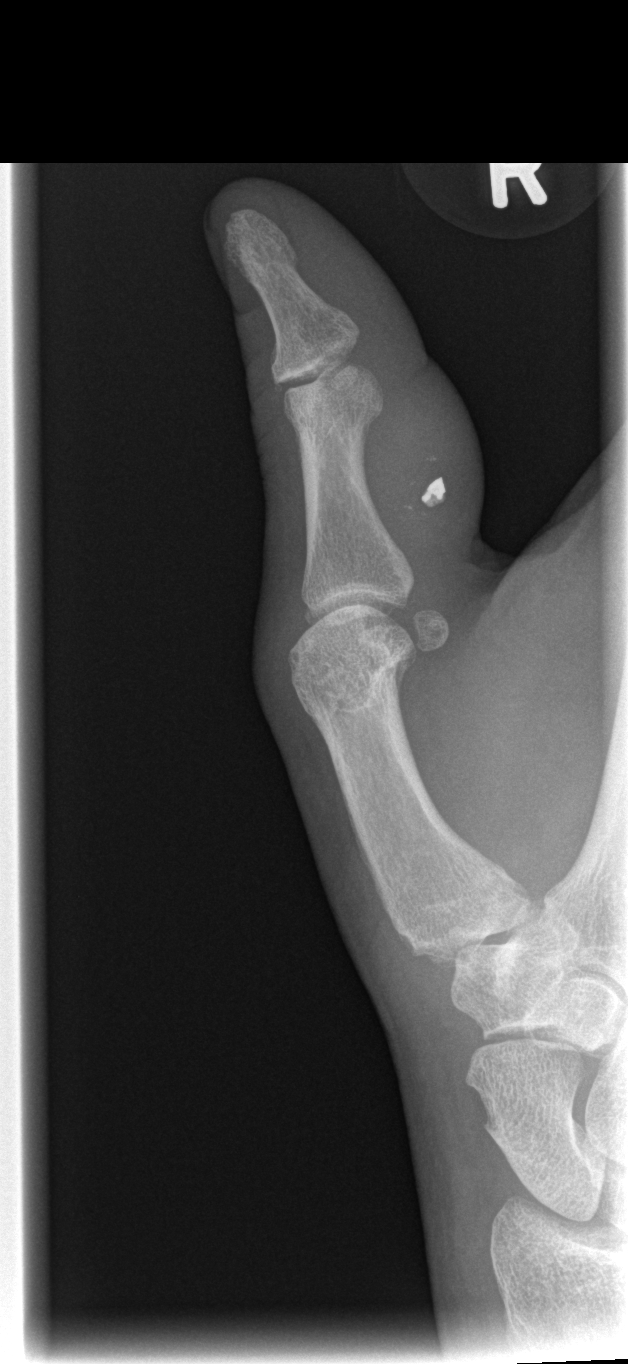

[x finger lateral right]
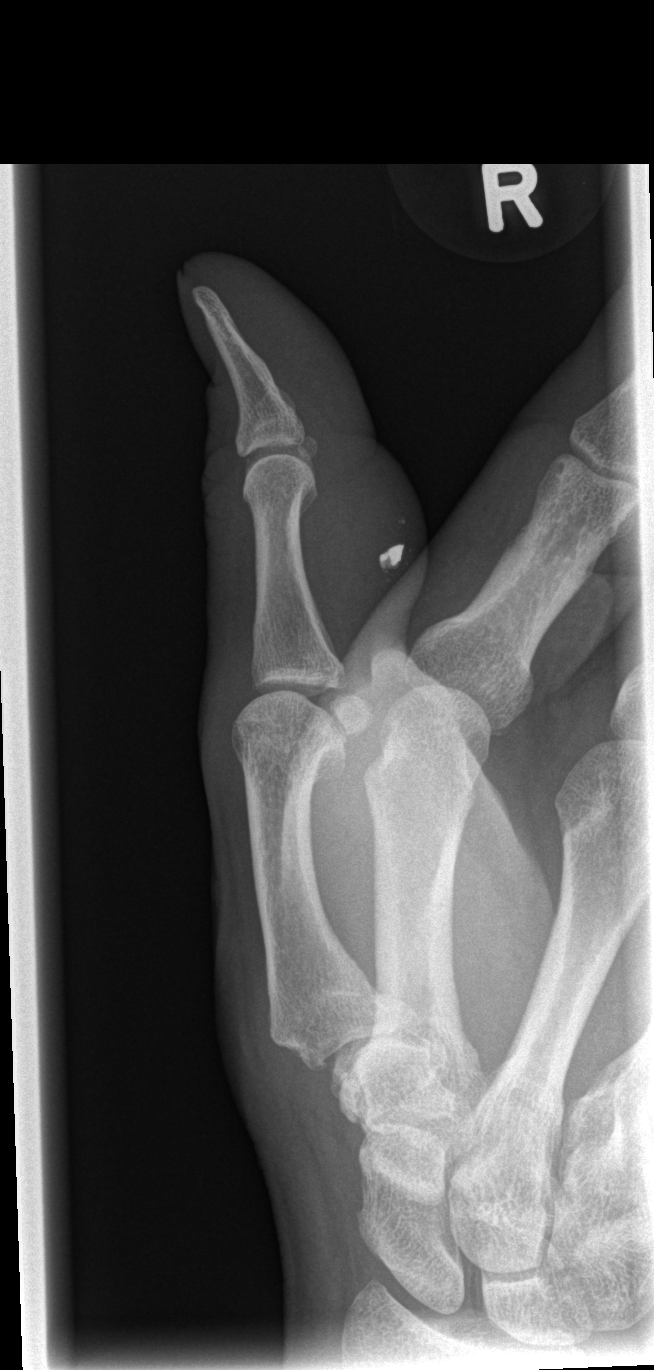

[3 of 3 positions shown; findings below may reference images not displayed]

FINDINGS: Metal foreign body is present in the soft tissues of the
thumb with surrounding soft tissue swelling.  The foreign body
measures approximately 5 mm with several tiny flecks of density
adjacent to the larger piece.  No gas in the soft tissues.  No bony
abnormality.
IMPRESSION: Metal foreign body in the soft tissues with surrounding soft tissue
swelling.
